# Patient Record
Sex: Male | Born: 1990 | Race: Black or African American | Hispanic: No | Marital: Single | Smoking: Current some day smoker
Health system: Southern US, Community
[De-identification: ages and names within clinical notes are randomized; demographics above are authoritative.]

---

## 2017-09-27 ENCOUNTER — Emergency Department (HOSPITAL_COMMUNITY)
Admission: EM | Admit: 2017-09-27 | Discharge: 2017-09-27 | Disposition: A | Discharge status: Home / Self Care | Payer: Self-pay | Attending: Emergency Medicine | Admitting: Emergency Medicine

## 2017-09-27 ENCOUNTER — Other Ambulatory Visit: Payer: Self-pay

## 2017-09-27 ENCOUNTER — Encounter (HOSPITAL_COMMUNITY): Payer: Self-pay | Admitting: Emergency Medicine

## 2017-09-27 DIAGNOSIS — G43909 Migraine, unspecified, not intractable, without status migrainosus: Secondary | ICD-10-CM | POA: Insufficient documentation

## 2017-09-27 DIAGNOSIS — Z5321 Procedure and treatment not carried out due to patient leaving prior to being seen by health care provider: Secondary | ICD-10-CM | POA: Insufficient documentation

## 2017-09-27 NOTE — ED Notes (Signed)
09/27/2017, Follow-up call completed.  Pt. Verbalized that he had to leave and he will be back.

## 2017-09-27 NOTE — ED Notes (Signed)
Called for vitals x2, no answer 

## 2017-09-27 NOTE — ED Notes (Signed)
Pt is not in lobby.  Pt has been called four times, no answer.

## 2017-09-27 NOTE — ED Triage Notes (Signed)
Pt to ER for evaluation of "migraine off and on x1 month," states "my family has high blood pressure and I'm worried I do to." BP at this time 130/86, patient in NAD, other vitals stable. Pt ambulatory.

## 2019-08-29 ENCOUNTER — Encounter (HOSPITAL_COMMUNITY): Payer: Self-pay | Admitting: Emergency Medicine

## 2019-08-29 ENCOUNTER — Emergency Department (HOSPITAL_COMMUNITY)
Admission: EM | Admit: 2019-08-29 | Discharge: 2019-08-30 | Disposition: A | Discharge status: Home / Self Care | Payer: Self-pay | Attending: Emergency Medicine | Admitting: Emergency Medicine

## 2019-08-29 DIAGNOSIS — Z5321 Procedure and treatment not carried out due to patient leaving prior to being seen by health care provider: Secondary | ICD-10-CM | POA: Insufficient documentation

## 2019-08-29 DIAGNOSIS — R05 Cough: Secondary | ICD-10-CM | POA: Insufficient documentation

## 2019-08-29 DIAGNOSIS — R509 Fever, unspecified: Secondary | ICD-10-CM | POA: Insufficient documentation

## 2019-08-29 NOTE — ED Triage Notes (Signed)
Patient reports he has had cough X few days with subjective fevers. NOT vaccinated against COVID

## 2019-08-30 ENCOUNTER — Ambulatory Visit (HOSPITAL_COMMUNITY)
Admission: EM | Admit: 2019-08-30 | Discharge: 2019-08-30 | Disposition: A | Discharge status: Home / Self Care | Payer: HRSA Program | Attending: Family Medicine | Admitting: Family Medicine

## 2019-08-30 ENCOUNTER — Encounter (HOSPITAL_COMMUNITY): Payer: Self-pay | Admitting: Emergency Medicine

## 2019-08-30 ENCOUNTER — Other Ambulatory Visit: Payer: Self-pay

## 2019-08-30 ENCOUNTER — Ambulatory Visit (INDEPENDENT_AMBULATORY_CARE_PROVIDER_SITE_OTHER): Payer: HRSA Program

## 2019-08-30 DIAGNOSIS — R059 Cough, unspecified: Secondary | ICD-10-CM

## 2019-08-30 DIAGNOSIS — R0602 Shortness of breath: Secondary | ICD-10-CM | POA: Insufficient documentation

## 2019-08-30 DIAGNOSIS — U071 COVID-19: Secondary | ICD-10-CM | POA: Diagnosis not present

## 2019-08-30 DIAGNOSIS — R519 Headache, unspecified: Secondary | ICD-10-CM | POA: Diagnosis not present

## 2019-08-30 DIAGNOSIS — R05 Cough: Secondary | ICD-10-CM | POA: Insufficient documentation

## 2019-08-30 DIAGNOSIS — F17290 Nicotine dependence, other tobacco product, uncomplicated: Secondary | ICD-10-CM | POA: Insufficient documentation

## 2019-08-30 DIAGNOSIS — R110 Nausea: Secondary | ICD-10-CM | POA: Diagnosis not present

## 2019-08-30 MED ORDER — PREDNISONE 20 MG PO TABS: 40.0000 mg | ORAL_TABLET | Freq: Every day | ORAL | 0 refills | Status: AC

## 2019-08-30 MED ORDER — BENZONATATE 100 MG PO CAPS
100.0000 mg | ORAL_CAPSULE | Freq: Three times a day (TID) | ORAL | 0 refills | Status: AC | PRN
Start: 2019-08-30 — End: ?

## 2019-08-30 NOTE — ED Provider Notes (Signed)
[facility]    CSN: [id number] Arrival date & time: 08/30/19  1532      History   Chief Complaint Chief Complaint  Patient presents with   Cough    HPI Jackson Lee is a 29 y.o. male.   Jackson Lee presents with complaints of cough, for the past 8-9 days. Causes shortness of breath. Fever last week, which has resolved. Nausea with coughing which causes him to spit up. Decreased appetite. Poor sleep due to cough. Cough is dry. No nasal drainage. Sore throat last week which has resolved. No ear pain. Did have diarrhea which has also resolved. Tylenol has helped with headache. Had been drinking teas and taking delsym which hadn't helped. Doesn't smoke. No history of asthma. Was around someone recently with bronchitis. Currently unemployed. No history of covid-19 and has not received vaccination.   ROS per HPI, negative if not otherwise mentioned.      History reviewed. No pertinent past medical history.  There are no problems to display for this patient.   History reviewed. No pertinent surgical history.     Home Medications    Prior to Admission medications   Medication Sig Start Date End Date Taking? Authorizing Provider  benzonatate (TESSALON) 100 MG capsule Take 1-2 capsules (100-200 mg total) by mouth 3 (three) times daily as needed for cough. 08/30/19   Georgetta Haber, NP  predniSONE (DELTASONE) 20 MG tablet Take 2 tablets (40 mg total) by mouth daily with breakfast for 5 days. 08/30/19 09/04/19  Georgetta Haber, NP    Family History Family History  Problem Relation Age of Onset   Healthy Mother    Diabetes Father     Social History Social History   Tobacco Use   Smoking status: Current Some Day Smoker    Types: Cigars   Smokeless tobacco: Never Used  Substance Use Topics   Alcohol use: Not on file   Drug use: Not on file     Allergies   Patient has no known allergies.   Review of Systems Review of Systems   Physical  Exam Triage Vital Signs ED Triage Vitals  Enc Vitals Group     BP --      Pulse Rate 08/30/19 1722 (!) 108     Resp 08/30/19 1722 20     Temp 08/30/19 1722 98.7 F (37.1 C)     Temp Source 08/30/19 1722 Oral     SpO2 08/30/19 1722 95 %     Weight --      Height --      Head Circumference --      Peak Flow --      Pain Score 08/30/19 1720 0     Pain Loc --      Pain Edu? --      Excl. in GC? --    No data found.  Updated Vital Signs Pulse (!) 108    Temp 98.7 F (37.1 C) (Oral)    Resp 20    SpO2 95%    Physical Exam Constitutional:      Appearance: He is well-developed.  Cardiovascular:     Rate and Rhythm: Tachycardia present.  Pulmonary:     Effort: Pulmonary effort is normal.     Comments: Occasional dry cough noted  Skin:    General: Skin is warm and dry.  Neurological:     Mental Status: He is alert and oriented to person, place, and time.  UC Treatments / Results  Labs (all labs ordered are listed, but only abnormal results are displayed) Labs Reviewed  SARS CORONAVIRUS 2 (TAT 6-24 HRS)    EKG   Radiology DG Chest 2 View  Result Date: 08/30/2019 CLINICAL DATA:  29 year old male with cough. EXAM: CHEST - 2 VIEW COMPARISON:  None. FINDINGS: Minimal bibasilar atelectasis. No focal consolidation, pleural effusion, pneumothorax. The cardiac silhouette is within limits. No acute osseous pathology. IMPRESSION: No active cardiopulmonary disease. Electronically Signed   By: Elgie Collard M.D.   On: 08/30/2019 18:43    Procedures Procedures (including critical care time)  Medications Ordered in UC Medications - No data to display  Initial Impression / Assessment and Plan / UC Course  I have reviewed the triage vital signs and the nursing notes.  Pertinent labs & imaging results that were available during my care of the patient were reviewed by me and considered in my medical decision making (see chart for details).     Xray without acute  findings. Persistent dry cough. Tessalon and prednisone provided. covid testing pending. Return precautions provided. Patient verbalized understanding and agreeable to plan.   Final Clinical Impressions(s) / UC Diagnoses   Final diagnoses:  Cough     Discharge Instructions     Xray is normal today which is reassuring. Self isolate until covid results are back and negative.  Will notify you by phone of any positive findings. Your negative results will be sent through your MyChart.    Push fluids to ensure adequate hydration and keep secretions thin.  Tessalon as needed for cough.  I am also hopeful that prednisone is helpful with your cough.  If symptoms worsen or do not improve in the next week to return to be seen or to follow up with your PCP.      ED Prescriptions    Medication Sig Dispense Auth. Provider   benzonatate (TESSALON) 100 MG capsule Take 1-2 capsules (100-200 mg total) by mouth 3 (three) times daily as needed for cough. 21 capsule Linus Mako B, NP   predniSONE (DELTASONE) 20 MG tablet Take 2 tablets (40 mg total) by mouth daily with breakfast for 5 days. 10 tablet Georgetta Haber, NP     PDMP not reviewed this encounter.   Georgetta Haber, NP 08/30/19 1902

## 2019-08-30 NOTE — ED Triage Notes (Signed)
Pt presents with dry cough xs 9 days. Had fever last week for approx 24 hours. C/o diarrhea, nausea, chest pain and sob with coughing.   Tylenol gives some relief.

## 2019-08-30 NOTE — Discharge Instructions (Signed)
Xray is normal today which is reassuring. Self isolate until covid results are back and negative.  Will notify you by phone of any positive findings. Your negative results will be sent through your MyChart.    Push fluids to ensure adequate hydration and keep secretions thin.  Tessalon as needed for cough.  I am also hopeful that prednisone is helpful with your cough.  If symptoms worsen or do not improve in the next week to return to be seen or to follow up with your PCP.

## 2019-08-30 NOTE — ED Notes (Signed)
No answer for vitals recheck x1 

## 2019-08-31 LAB — SARS CORONAVIRUS 2 (TAT 6-24 HRS): SARS Coronavirus 2: POSITIVE — AB

## 2021-01-14 IMAGING — DX DG CHEST 2V
2 series · 2 of 2 positions shown · non-contrast
Comparison: None.

CLINICAL DATA: 29-year-old male with cough.

EXAM:
CHEST - 2 VIEW

[chest pa]
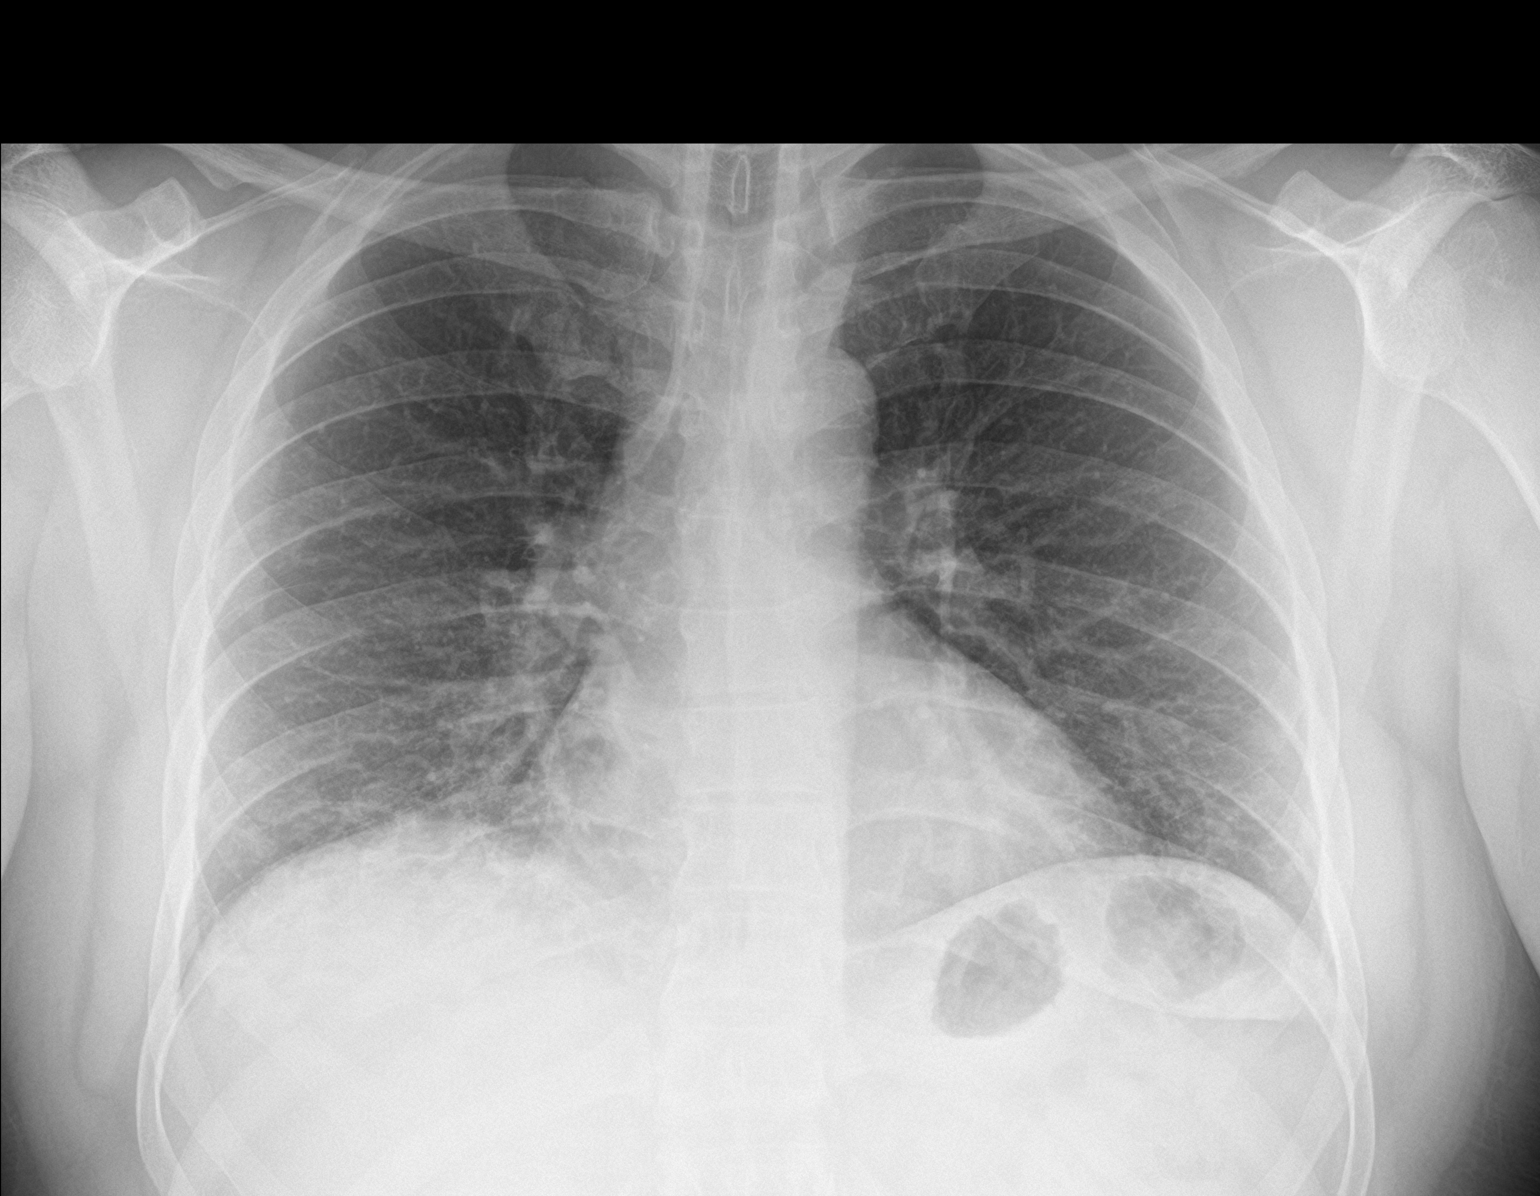

[chest lat]
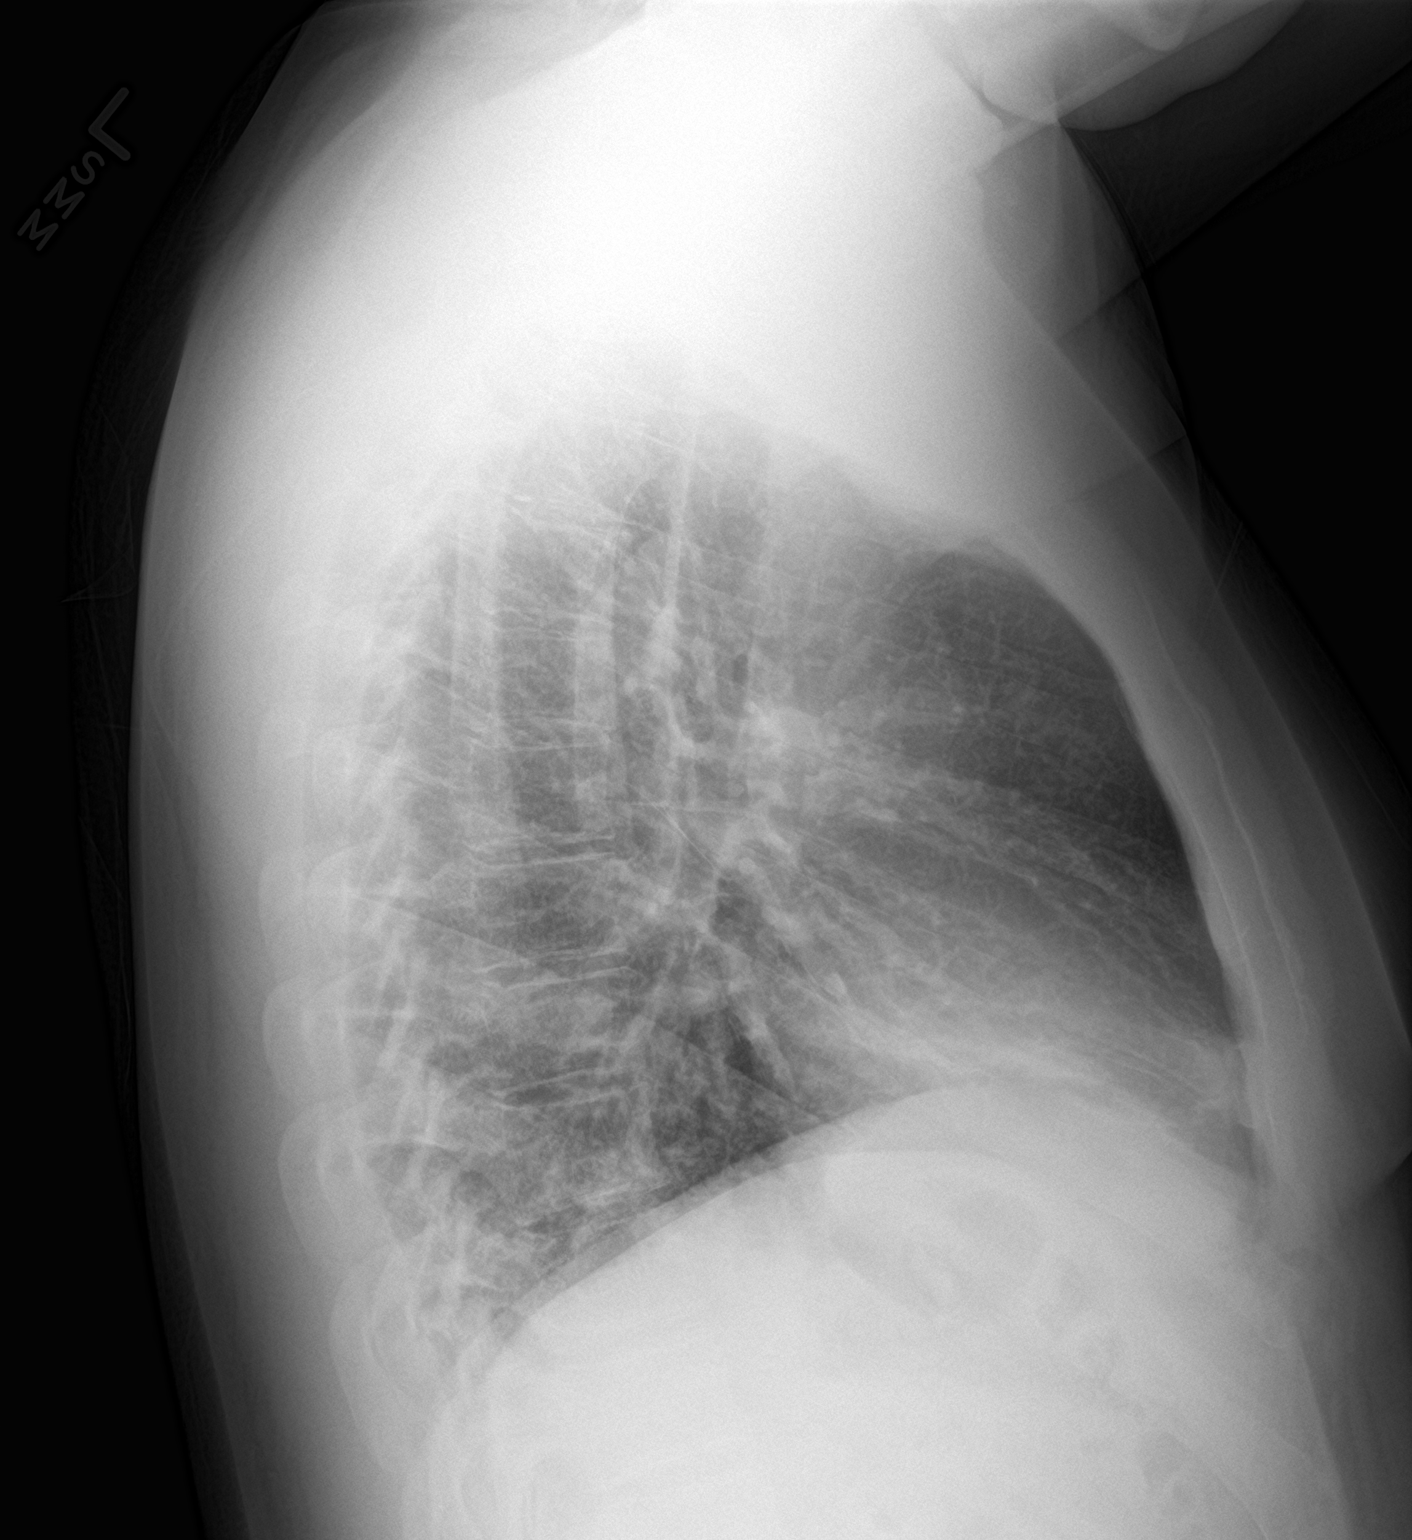

[2 of 2 positions shown; findings below may reference images not displayed]

FINDINGS: Minimal bibasilar atelectasis. No focal consolidation, pleural
effusion, pneumothorax. The cardiac silhouette is within limits. No
acute osseous pathology.
IMPRESSION: No active cardiopulmonary disease.
